# Patient Record
Sex: Female | Born: 1965 | Race: Black or African American | Hispanic: No | Marital: Married | Smoking: Never smoker
Health system: Southern US, Community
[De-identification: ages and names within clinical notes are randomized; demographics above are authoritative.]

## PROBLEM LIST (undated history)

## (undated) DIAGNOSIS — K219 Gastro-esophageal reflux disease without esophagitis: Secondary | ICD-10-CM

## (undated) DIAGNOSIS — I10 Essential (primary) hypertension: Secondary | ICD-10-CM

## (undated) DIAGNOSIS — Z8719 Personal history of other diseases of the digestive system: Secondary | ICD-10-CM

## (undated) HISTORY — PX: BREAST SURGERY: SHX581

## (undated) HISTORY — PX: CHOLECYSTECTOMY: SHX55

## (undated) HISTORY — PX: TUBAL LIGATION: SHX77

## (undated) HISTORY — PX: ABDOMINAL HYSTERECTOMY: SHX81

## (undated) HISTORY — PX: BREAST EXCISIONAL BIOPSY: SUR124

---

## 1998-01-26 ENCOUNTER — Other Ambulatory Visit: Admission: RE | Admit: 1998-01-26 | Discharge: 1998-01-26 | Payer: Self-pay | Admitting: Obstetrics

## 1999-02-16 ENCOUNTER — Other Ambulatory Visit: Admission: RE | Admit: 1999-02-16 | Discharge: 1999-02-16 | Payer: Self-pay | Admitting: Obstetrics

## 1999-02-23 ENCOUNTER — Ambulatory Visit (HOSPITAL_COMMUNITY): Admission: RE | Admit: 1999-02-23 | Discharge: 1999-02-23 | Payer: Self-pay | Admitting: Obstetrics

## 1999-02-23 ENCOUNTER — Encounter: Payer: Self-pay | Admitting: Obstetrics

## 1999-02-25 ENCOUNTER — Ambulatory Visit (HOSPITAL_COMMUNITY): Admission: RE | Admit: 1999-02-25 | Discharge: 1999-02-25 | Payer: Self-pay | Admitting: Obstetrics

## 1999-08-13 ENCOUNTER — Encounter: Admission: RE | Admit: 1999-08-13 | Discharge: 1999-08-13 | Payer: Self-pay | Admitting: Family Medicine

## 1999-08-13 ENCOUNTER — Encounter: Payer: Self-pay | Admitting: Family Medicine

## 2000-03-21 ENCOUNTER — Other Ambulatory Visit: Admission: RE | Admit: 2000-03-21 | Discharge: 2000-03-21 | Payer: Self-pay | Admitting: Obstetrics

## 2001-07-18 ENCOUNTER — Ambulatory Visit (HOSPITAL_COMMUNITY): Admission: RE | Admit: 2001-07-18 | Discharge: 2001-07-18 | Payer: Self-pay | Admitting: Gastroenterology

## 2003-10-03 ENCOUNTER — Encounter (INDEPENDENT_AMBULATORY_CARE_PROVIDER_SITE_OTHER): Payer: Self-pay | Admitting: Specialist

## 2003-10-03 ENCOUNTER — Ambulatory Visit (HOSPITAL_COMMUNITY): Admission: RE | Admit: 2003-10-03 | Discharge: 2003-10-03 | Payer: Self-pay | Admitting: Obstetrics

## 2004-02-12 ENCOUNTER — Ambulatory Visit (HOSPITAL_COMMUNITY): Admission: RE | Admit: 2004-02-12 | Discharge: 2004-02-12 | Payer: Self-pay | Admitting: Obstetrics

## 2004-02-19 ENCOUNTER — Encounter (INDEPENDENT_AMBULATORY_CARE_PROVIDER_SITE_OTHER): Payer: Self-pay | Admitting: *Deleted

## 2004-02-19 ENCOUNTER — Encounter: Admission: RE | Admit: 2004-02-19 | Discharge: 2004-02-19 | Payer: Self-pay | Admitting: Obstetrics

## 2004-04-16 ENCOUNTER — Ambulatory Visit (HOSPITAL_BASED_OUTPATIENT_CLINIC_OR_DEPARTMENT_OTHER): Admission: RE | Admit: 2004-04-16 | Discharge: 2004-04-16 | Payer: Self-pay | Admitting: General Surgery

## 2004-04-16 ENCOUNTER — Encounter: Admission: RE | Admit: 2004-04-16 | Discharge: 2004-04-16 | Payer: Self-pay | Admitting: General Surgery

## 2004-04-16 ENCOUNTER — Ambulatory Visit (HOSPITAL_COMMUNITY): Admission: RE | Admit: 2004-04-16 | Discharge: 2004-04-16 | Payer: Self-pay | Admitting: General Surgery

## 2004-04-16 ENCOUNTER — Encounter (INDEPENDENT_AMBULATORY_CARE_PROVIDER_SITE_OTHER): Payer: Self-pay | Admitting: *Deleted

## 2004-09-01 ENCOUNTER — Encounter: Admission: RE | Admit: 2004-09-01 | Discharge: 2004-09-01 | Payer: Self-pay | Admitting: Obstetrics

## 2005-04-05 ENCOUNTER — Encounter: Admission: RE | Admit: 2005-04-05 | Discharge: 2005-04-05 | Payer: Self-pay | Admitting: Obstetrics

## 2006-04-07 ENCOUNTER — Encounter: Admission: RE | Admit: 2006-04-07 | Discharge: 2006-04-07 | Payer: Self-pay | Admitting: Obstetrics

## 2007-04-23 ENCOUNTER — Encounter: Admission: RE | Admit: 2007-04-23 | Discharge: 2007-04-23 | Payer: Self-pay | Admitting: Obstetrics

## 2008-05-05 ENCOUNTER — Encounter: Admission: RE | Admit: 2008-05-05 | Discharge: 2008-05-05 | Payer: Self-pay | Admitting: Family Medicine

## 2008-10-17 ENCOUNTER — Other Ambulatory Visit: Admission: RE | Admit: 2008-10-17 | Discharge: 2008-10-17 | Payer: Self-pay | Admitting: Family Medicine

## 2009-04-21 ENCOUNTER — Encounter (INDEPENDENT_AMBULATORY_CARE_PROVIDER_SITE_OTHER): Payer: Self-pay | Admitting: Obstetrics & Gynecology

## 2009-04-21 ENCOUNTER — Ambulatory Visit (HOSPITAL_COMMUNITY): Admission: RE | Admit: 2009-04-21 | Discharge: 2009-04-22 | Payer: Self-pay | Admitting: Obstetrics & Gynecology

## 2009-05-12 ENCOUNTER — Encounter: Admission: RE | Admit: 2009-05-12 | Discharge: 2009-05-12 | Payer: Self-pay | Admitting: Obstetrics & Gynecology

## 2010-06-08 ENCOUNTER — Encounter
Admission: RE | Admit: 2010-06-08 | Discharge: 2010-06-08 | Payer: Self-pay | Source: Home / Self Care | Attending: Obstetrics & Gynecology | Admitting: Obstetrics & Gynecology

## 2010-09-15 LAB — CBC
Hemoglobin: 10.7 g/dL — ABNORMAL LOW (ref 12.0–15.0)
Hemoglobin: 13 g/dL (ref 12.0–15.0)
MCHC: 33.6 g/dL (ref 30.0–36.0)
MCHC: 33.8 g/dL (ref 30.0–36.0)
RBC: 4.31 MIL/uL (ref 3.87–5.11)
RDW: 14.1 % (ref 11.5–15.5)
RDW: 14.7 % (ref 11.5–15.5)
WBC: 8.8 10*3/uL (ref 4.0–10.5)

## 2010-09-15 LAB — PREGNANCY, URINE: Preg Test, Ur: NEGATIVE

## 2010-09-15 LAB — BASIC METABOLIC PANEL
CO2: 25 mEq/L (ref 19–32)
Chloride: 105 mEq/L (ref 96–112)
Potassium: 3.9 mEq/L (ref 3.5–5.1)

## 2010-10-29 NOTE — Op Note (Signed)
NAMEKYNADI, DRAGOS NO.:  [id number]   MEDICAL RECORD NO.:  [id number]          PATIENT TYPE:  AMB   LOCATION:  DSC                          FACILITY:  [facility]   PHYSICIAN:  Leonie Man, M.D.   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   PROCEDURE:  Needle localization followed by right-sided lumpectomy.   SURGEON:  Leonie Man, M.D.   ASSISTANT:  Nurse.   ANESTHESIA:  General.   INDICATIONS:  This patient is a 45 year old female presenting with an  abnormal mammogram showing a lesion in the 12 o'clock axis of the right  breast.  She underwent ultrasound-guided core biopsy of this lesion with  pathology showing a sclerosing papilloma.  The patient comes to the  operating room now for wide excision of this lesion just to rule out the  possibility of a papillary carcinoma.   DESCRIPTION OF PROCEDURE:  The risks and benefits of surgery had been fully  discussed with the patient.  All questions answered and consent obtained.  Following the induction of satisfactory general anesthesia, the patient was  positioned supinely.  The right breast is prepped and draped to be included  in the sterile operative field.  The region adjacent to the localizing  needle is then incised down upon, deepening this through the skin and  subcutaneous tissue.  The localizing needle is then brought into the  incision site, and an entire wedge of breast tissue surrounding the needle  is carried down just about to the chest wall.  The lesion and the breast  specimen is removed and forwarded for radiologic diagnosis.  The lesion is  identified on ultrasound of the mass.  This is noted to  be contained well  within the specimen.  Hemostasis is obtained with electrocautery, and sponge  and instrument counts clarified.  The subcutaneous tissue is closed with  interrupted 3-0 Vicryl sutures, and the skin is closed with 5-0 Monocryl  suture and then reinforced with Dermabond.  Sterile dressing is applied. The  patient removed from the operating room to the recovery room in stable  condition.  She tolerated the procedure well.      Patr   PB/MEDQ  D:  04/16/2004  T:  04/17/2004  Job:  [id number]

## 2010-10-29 NOTE — Op Note (Signed)
NAME:  Sylvia Sanford, WIETING NO.:  [id number]   MEDICAL RECORD NO.:  [id number]                   PATIENT TYPE:  AMB   LOCATION:  SDC                                  FACILITY:  [facility]   PHYSICIAN:  Kathreen Cosier, M.D.           DATE OF BIRTH:  [birth year]   DATE OF PROCEDURE:  10/03/2003  DATE OF DISCHARGE:                                 OPERATIVE REPORT   PREOPERATIVE DIAGNOSIS:  Dysfunctional uterine bleeding and menorrhagia.   PROCEDURE:  Diagnostic hysteroscopy, D&C, and NovaSure ablation.   Under general anesthesia with the patient in lithotomy position the perineum  and vagina prepped and draped, the bladder emptied with a straight catheter.  Bimanual exam revealed the uterus to be normal size, negative adnexa.  Speculum placed in the vagina.  The anterior lip of the cervix grasped with  a tenaculum and the cervix curetted.  Small amount of tissue obtained.  The  endometrial cavity sounded 9 cm.  The cervical length was measured at 4 cm  with the Hagar dilator.  The cervix was then dilated to #27 Shawnie Pons and the  diagnostic hysteroscope inserted.  The pump was set on 80 and the fluid  deficit was 50 mL.  Endometrial cavity was normal.  The NovaSure ablation  device was inserted in the endometrial cavity until the fundus was felt and  then the device pulled back 1 cm.  It was then seated and tested and the CIA  test was done with carbon dioxide and then the ablation done.  The power was  at 06 watts for 90 seconds and the cavity was 3.5 cm.  After the ablation  was complete hysteroscopy was performed again and the cavity was felt to be  totally ablated.  The patient tolerated the procedure well, taken to the  recovery room in good condition.                                               Kathreen Cosier, M.D.    BAM/MEDQ  D:  10/03/2003  T:  10/03/2003  Job:  [id number]

## 2010-10-29 NOTE — Procedures (Signed)
[facility]  Patient:    Sylvia Sanford, Sylvia Sanford Visit Number: [id number] MRN: [id number]          Service Type: Attending:  Verlin Grills, M.D. Dictated by:   Verlin Grills, M.D. Proc. Date: 07/18/01   CC:         Desma Maxim, M.D.   Procedure Report  PROCEDURE:  Esophagogastroduodenoscopy.  REFERRING PHYSICIAN:  Donia Guiles, M.D.  INDICATION FOR PROCEDURE:  Sylvia Sanford is a 45 year old female born [birth year]. She is referred to me by Dr. Donia Guiles with a six month history of heartburn unassociated with dysphagia, odynophagia, nausea, vomiting or gastrointestinal bleeding. She last underwent an esophagogastroduodenoscopy in 1994 to evaluate abdominal pain and was told she had gastritis and a hiatal hernia.  Sylvia Sanford is currently taking Aciphex which was given to her as samples by Dr. Arvilla Market. Proton pump inhibitor therapy is keeping her heartburn free.  MEDICATION ALLERGIES:  None.  CHRONIC MEDICATIONS:  1. Aciphex.  2. Serafem.  3. Ortho-Novum 777.  PAST MEDICAL HISTORY:  1. Heartburn.  2. Laparoscopic cholecystectomy.  3. Bone spur surgery.  ENDOSCOPIST:  Verlin Grills, M.D.  PREMEDICATION:  Versed 7.5 mg.  ENDOSCOPE:  Olympus gastroscope.  DESCRIPTION OF PROCEDURE:  After obtaining informed consent, the patient was placed in the left lateral decubitus position. I administered intravenous Versed to achieve conscious sedation for the procedure. The patients cardiac rhythm, oxygen saturation and blood pressure were monitored throughout the procedure and documented in the medical record.  The Olympus gastroscope was passed through the posterior hypopharynx into the proximal esophagus without difficulty. The hypopharynx and larynx appeared normal. I did not visualize the vocal chords.  ESOPHAGOSCOPY:  The proximal, mid and lower segments of the esophagus appeared normal. The esophagogastric junction  and squamocolumnar junction are noted at approximately 37 cm from the incisor teeth. Endoscopically, there is no evidence for the presence of Barretts esophagus, erosive esophagitis, esophageal mucosal scarring or esophageal ulceration.  GASTROSCOPY:  Sylvia Sanford has a small hiatal hernia. Retroflexed view of the gastric cardia and fundus was normal. The gastric body, antrum and pylorus appeared completely normal.  DUODENOSCOPY:  The duodenal bulb, mid duodenum, and distal duodenum appear normal.  ASSESSMENT: Small hiatal hernia; otherwise normal esophagogastroduodenoscopy.  RECOMMENDATIONS:  Sylvia Sanford has nonerosive esophageal reflux disease associated with a small hiatal hernia. Her proton pump inhibitor medication (Aciphex) is keeping her heartburn free. Dictated by:   Verlin Grills, M.D. Attending:  Verlin Grills, M.D. DD:  07/18/01 TD:  07/18/01 Job: [id number]

## 2010-12-10 ENCOUNTER — Other Ambulatory Visit: Payer: Self-pay | Admitting: Obstetrics & Gynecology

## 2011-05-10 ENCOUNTER — Other Ambulatory Visit: Payer: Self-pay | Admitting: Obstetrics & Gynecology

## 2011-05-10 DIAGNOSIS — Z1231 Encounter for screening mammogram for malignant neoplasm of breast: Secondary | ICD-10-CM

## 2011-06-10 ENCOUNTER — Ambulatory Visit
Admission: RE | Admit: 2011-06-10 | Discharge: 2011-06-10 | Disposition: A | Discharge status: Home / Self Care | Payer: Federal, State, Local not specified - PPO | Source: Ambulatory Visit | Attending: Obstetrics & Gynecology | Admitting: Obstetrics & Gynecology

## 2011-06-10 DIAGNOSIS — Z1231 Encounter for screening mammogram for malignant neoplasm of breast: Secondary | ICD-10-CM

## 2012-01-06 ENCOUNTER — Other Ambulatory Visit: Payer: Self-pay | Admitting: Family Medicine

## 2012-01-06 DIAGNOSIS — E049 Nontoxic goiter, unspecified: Secondary | ICD-10-CM

## 2012-01-13 ENCOUNTER — Ambulatory Visit
Admission: RE | Admit: 2012-01-13 | Discharge: 2012-01-13 | Disposition: A | Discharge status: Home / Self Care | Payer: Federal, State, Local not specified - PPO | Source: Ambulatory Visit | Attending: Family Medicine | Admitting: Family Medicine

## 2012-01-13 DIAGNOSIS — E049 Nontoxic goiter, unspecified: Secondary | ICD-10-CM

## 2012-05-09 ENCOUNTER — Other Ambulatory Visit: Payer: Self-pay | Admitting: Obstetrics & Gynecology

## 2012-05-09 DIAGNOSIS — Z1231 Encounter for screening mammogram for malignant neoplasm of breast: Secondary | ICD-10-CM

## 2012-06-20 ENCOUNTER — Ambulatory Visit
Admission: RE | Admit: 2012-06-20 | Discharge: 2012-06-20 | Disposition: A | Discharge status: Home / Self Care | Payer: Federal, State, Local not specified - PPO | Source: Ambulatory Visit | Attending: Obstetrics & Gynecology | Admitting: Obstetrics & Gynecology

## 2012-06-20 DIAGNOSIS — Z1231 Encounter for screening mammogram for malignant neoplasm of breast: Secondary | ICD-10-CM

## 2013-05-15 ENCOUNTER — Other Ambulatory Visit: Payer: Self-pay

## 2013-05-15 DIAGNOSIS — Z1231 Encounter for screening mammogram for malignant neoplasm of breast: Secondary | ICD-10-CM

## 2013-06-26 ENCOUNTER — Ambulatory Visit: Payer: Federal, State, Local not specified - PPO

## 2013-06-27 ENCOUNTER — Ambulatory Visit
Admission: RE | Admit: 2013-06-27 | Discharge: 2013-06-27 | Disposition: A | Discharge status: Home / Self Care | Payer: Federal, State, Local not specified - PPO | Source: Ambulatory Visit

## 2013-06-27 DIAGNOSIS — Z1231 Encounter for screening mammogram for malignant neoplasm of breast: Secondary | ICD-10-CM

## 2014-05-16 ENCOUNTER — Other Ambulatory Visit: Payer: Self-pay

## 2014-05-16 DIAGNOSIS — Z1231 Encounter for screening mammogram for malignant neoplasm of breast: Secondary | ICD-10-CM

## 2014-05-26 ENCOUNTER — Ambulatory Visit
Admission: RE | Admit: 2014-05-26 | Discharge: 2014-05-26 | Disposition: A | Discharge status: Home / Self Care | Payer: Federal, State, Local not specified - PPO | Source: Ambulatory Visit | Attending: Family Medicine | Admitting: Family Medicine

## 2014-05-26 ENCOUNTER — Other Ambulatory Visit: Payer: Self-pay | Admitting: Family Medicine

## 2014-05-26 DIAGNOSIS — R059 Cough, unspecified: Secondary | ICD-10-CM

## 2014-05-26 DIAGNOSIS — R05 Cough: Secondary | ICD-10-CM

## 2014-07-02 ENCOUNTER — Ambulatory Visit
Admission: RE | Admit: 2014-07-02 | Discharge: 2014-07-02 | Disposition: A | Discharge status: Home / Self Care | Payer: Federal, State, Local not specified - PPO | Source: Ambulatory Visit

## 2014-07-02 DIAGNOSIS — Z1231 Encounter for screening mammogram for malignant neoplasm of breast: Secondary | ICD-10-CM

## 2014-09-10 ENCOUNTER — Other Ambulatory Visit: Payer: Self-pay | Admitting: Gastroenterology

## 2014-11-24 ENCOUNTER — Encounter (HOSPITAL_COMMUNITY): Payer: Self-pay | Admitting: *Deleted

## 2014-12-01 ENCOUNTER — Other Ambulatory Visit: Payer: Self-pay | Admitting: Gastroenterology

## 2014-12-01 NOTE — Anesthesia Preprocedure Evaluation (Signed)
Anesthesia Evaluation  Patient identified by MRN, date of birth, ID band Patient awake    Reviewed: Allergy & Precautions, NPO status , Patient's Chart, lab work & pertinent test results  History of Anesthesia Complications Negative for: history of anesthetic complications  Airway Mallampati: II  TM Distance: >3 FB Neck ROM: Full    Dental no notable dental hx. (+) Dental Advisory Given   Pulmonary neg pulmonary ROS,  breath sounds clear to auscultation  Pulmonary exam normal       Cardiovascular hypertension, Pt. on medications Normal cardiovascular examRhythm:Regular Rate:Normal     Neuro/Psych negative neurological ROS  negative psych ROS   GI/Hepatic Neg liver ROS, hiatal hernia, GERD-  Medicated and Controlled,  Endo/Other  negative endocrine ROS  Renal/GU negative Renal ROS  negative genitourinary   Musculoskeletal negative musculoskeletal ROS (+)   Abdominal   Peds negative pediatric ROS (+)  Hematology negative hematology ROS (+)   Anesthesia Other Findings   Reproductive/Obstetrics negative OB ROS                             Anesthesia Physical Anesthesia Plan  ASA: II  Anesthesia Plan: MAC   Post-op Pain Management:    Induction: Intravenous  Airway Management Planned: Nasal Cannula  Additional Equipment:   Intra-op Plan:   Post-operative Plan:   Informed Consent: I have reviewed the patients History and Physical, chart, labs and discussed the procedure including the risks, benefits and alternatives for the proposed anesthesia with the patient or authorized representative who has indicated his/her understanding and acceptance.   Dental advisory given  Plan Discussed with: CRNA  Anesthesia Plan Comments:         Anesthesia Quick Evaluation

## 2014-12-02 ENCOUNTER — Ambulatory Visit (HOSPITAL_COMMUNITY)
Admission: RE | Admit: 2014-12-02 | Discharge: 2014-12-02 | Disposition: A | Discharge status: Home / Self Care | Payer: Federal, State, Local not specified - PPO | Source: Ambulatory Visit | Attending: Gastroenterology | Admitting: Gastroenterology

## 2014-12-02 ENCOUNTER — Ambulatory Visit (HOSPITAL_COMMUNITY): Payer: Federal, State, Local not specified - PPO | Admitting: Anesthesiology

## 2014-12-02 ENCOUNTER — Encounter (HOSPITAL_COMMUNITY)
Admission: RE | Disposition: A | Discharge status: Home / Self Care | Payer: Self-pay | Source: Ambulatory Visit | Attending: Gastroenterology

## 2014-12-02 ENCOUNTER — Encounter (HOSPITAL_COMMUNITY): Payer: Self-pay

## 2014-12-02 DIAGNOSIS — Z79899 Other long term (current) drug therapy: Secondary | ICD-10-CM | POA: Diagnosis not present

## 2014-12-02 DIAGNOSIS — I10 Essential (primary) hypertension: Secondary | ICD-10-CM | POA: Diagnosis not present

## 2014-12-02 DIAGNOSIS — Z9049 Acquired absence of other specified parts of digestive tract: Secondary | ICD-10-CM | POA: Diagnosis not present

## 2014-12-02 DIAGNOSIS — K219 Gastro-esophageal reflux disease without esophagitis: Secondary | ICD-10-CM | POA: Insufficient documentation

## 2014-12-02 DIAGNOSIS — Z1211 Encounter for screening for malignant neoplasm of colon: Secondary | ICD-10-CM | POA: Diagnosis not present

## 2014-12-02 HISTORY — DX: Personal history of other diseases of the digestive system: Z87.19

## 2014-12-02 HISTORY — PX: COLONOSCOPY WITH PROPOFOL: SHX5780

## 2014-12-02 HISTORY — DX: Gastro-esophageal reflux disease without esophagitis: K21.9

## 2014-12-02 HISTORY — DX: Essential (primary) hypertension: I10

## 2014-12-02 SURGERY — COLONOSCOPY WITH PROPOFOL
Anesthesia: Monitor Anesthesia Care

## 2014-12-02 MED ORDER — SODIUM CHLORIDE 0.9 % IV SOLN: INTRAVENOUS | Status: DC

## 2014-12-02 MED ORDER — PROPOFOL 10 MG/ML IV BOLUS
INTRAVENOUS | Status: DC | PRN
  Administered 2014-12-02: 30 mg via INTRAVENOUS
  Administered 2014-12-02: 20 mg via INTRAVENOUS
  Administered 2014-12-02: 50 mg via INTRAVENOUS
  Administered 2014-12-02: 20 mg via INTRAVENOUS

## 2014-12-02 MED ORDER — PROPOFOL 10 MG/ML IV BOLUS
INTRAVENOUS | Status: AC
  Filled 2014-12-02: qty 20

## 2014-12-02 MED ORDER — PROPOFOL INFUSION 10 MG/ML OPTIME
INTRAVENOUS | Status: DC | PRN
  Administered 2014-12-02: 100 ug/kg/min via INTRAVENOUS

## 2014-12-02 MED ORDER — LACTATED RINGERS IV SOLN
INTRAVENOUS | Status: DC
  Administered 2014-12-02: 1000 mL via INTRAVENOUS

## 2014-12-02 SURGICAL SUPPLY — 21 items

## 2014-12-02 NOTE — Anesthesia Postprocedure Evaluation (Signed)
  Anesthesia Post-op Note  Patient: Sylvia Sanford  Procedure(s) Performed: Procedure(s) (LRB): COLONOSCOPY WITH PROPOFOL (N/A)  Patient Location: PACU  Anesthesia Type: MAC  Level of Consciousness: awake and alert   Airway and Oxygen Therapy: Patient Spontanous Breathing  Post-op Pain: mild  Post-op Assessment: Post-op Vital signs reviewed, Patient's Cardiovascular Status Stable, Respiratory Function Stable, Patent Airway and No signs of Nausea or vomiting  Last Vitals:  Filed Vitals:   12/02/14 0814  BP: 132/90  Pulse: 88  Temp: 36.8 C  Resp: 21    Post-op Vital Signs: stable   Complications: No apparent anesthesia complications

## 2014-12-02 NOTE — Op Note (Signed)
Procedure: Baseline screening colonoscopy  Endoscopist: Danise Edge  Premedication: Propofol administered by anesthesia  Procedure: The patient was placed in the left lateral decubitus position. Anal inspection and digital rectal exam were normal. The Pentax pediatric colonoscope was introduced into the rectum and advanced to the cecum. A normal-appearing appendiceal orifice was identified. A normal-appearing ileocecal valve was intubated and the terminal ileum inspected. Colonic preparation for the exam today was good. Withdrawal time was 10 minutes  Rectum. Normal. Retroflexed view of the distal rectum was normal  Sigmoid colon and descending colon. Normal  Splenic flexure. Normal  Transverse colon. Normal  Hepatic flexure. Normal  Ascending colon. Normal  Cecum and ileocecal valve. Normal  Terminal ileum. Normal  Assessment: Normal screening colonoscopy  Recommendation: Schedule repeat screening colonoscopy in 10 years

## 2014-12-02 NOTE — H&P (Signed)
  Procedure: Baseline screening colonoscopy. No family history of colon cancer. Normal esophagogastroduodenoscopy performed on 03/31/2008  History: Patient is a 49 year old female born [birth year]. She is scheduled to undergo a screening colonoscopy with polypectomy to prevent colon cancer.  Past medical history: Gastroesophageal reflux associated with a normal esophagogastroduodenoscopy. Hysterectomy. Cholecystectomy.  Medication allergies: Duratuss causes vomiting  Exam: The patient is alert and lying comfortably on the endoscopy stretcher. Abdomen is soft and nontender to palpation. Lungs are clear to auscultation. Cardiac reveals a regular rhythm.  Plan: Proceed with baseline screening colonoscopy

## 2014-12-02 NOTE — Transfer of Care (Signed)
Immediate Anesthesia Transfer of Care Note  Patient: Sylvia Sanford  Procedure(s) Performed: Procedure(s): COLONOSCOPY WITH PROPOFOL (N/A)  Patient Location: PACU  Anesthesia Type:MAC  Level of Consciousness:  sedated, patient cooperative and responds to stimulation  Airway & Oxygen Therapy:Patient Spontanous Breathing and Patient connected to face mask oxgen  Post-op Assessment:  Report given to PACU RN and Post -op Vital signs reviewed and stable  Post vital signs:  Reviewed and stable  Last Vitals:  Filed Vitals:   12/02/14 0814  BP: 132/90  Pulse: 88  Temp: 36.8 C  Resp: 21    Complications: No apparent anesthesia complications

## 2014-12-03 ENCOUNTER — Encounter (HOSPITAL_COMMUNITY): Payer: Self-pay | Admitting: Gastroenterology

## 2015-06-11 ENCOUNTER — Other Ambulatory Visit: Payer: Self-pay

## 2015-06-11 DIAGNOSIS — Z1231 Encounter for screening mammogram for malignant neoplasm of breast: Secondary | ICD-10-CM

## 2015-07-08 ENCOUNTER — Ambulatory Visit
Admission: RE | Admit: 2015-07-08 | Discharge: 2015-07-08 | Disposition: A | Discharge status: Home / Self Care | Payer: Federal, State, Local not specified - PPO | Source: Ambulatory Visit

## 2015-07-08 DIAGNOSIS — Z1231 Encounter for screening mammogram for malignant neoplasm of breast: Secondary | ICD-10-CM

## 2015-09-04 ENCOUNTER — Other Ambulatory Visit: Payer: Self-pay | Admitting: Family Medicine

## 2015-09-04 ENCOUNTER — Ambulatory Visit
Admission: RE | Admit: 2015-09-04 | Discharge: 2015-09-04 | Disposition: A | Discharge status: Home / Self Care | Payer: Federal, State, Local not specified - PPO | Source: Ambulatory Visit | Attending: Family Medicine | Admitting: Family Medicine

## 2015-09-04 DIAGNOSIS — M7150 Other bursitis, not elsewhere classified, unspecified site: Secondary | ICD-10-CM

## 2015-10-14 DIAGNOSIS — H1045 Other chronic allergic conjunctivitis: Secondary | ICD-10-CM | POA: Diagnosis not present

## 2015-12-22 DIAGNOSIS — T148 Other injury of unspecified body region: Secondary | ICD-10-CM | POA: Diagnosis not present

## 2016-01-04 DIAGNOSIS — E782 Mixed hyperlipidemia: Secondary | ICD-10-CM | POA: Diagnosis not present

## 2016-01-20 ENCOUNTER — Other Ambulatory Visit: Payer: Self-pay | Admitting: Family Medicine

## 2016-01-20 ENCOUNTER — Ambulatory Visit
Admission: RE | Admit: 2016-01-20 | Discharge: 2016-01-20 | Disposition: A | Discharge status: Home / Self Care | Payer: Federal, State, Local not specified - PPO | Source: Ambulatory Visit | Attending: Family Medicine | Admitting: Family Medicine

## 2016-01-20 DIAGNOSIS — M7989 Other specified soft tissue disorders: Secondary | ICD-10-CM | POA: Diagnosis not present

## 2016-01-20 DIAGNOSIS — M79641 Pain in right hand: Secondary | ICD-10-CM

## 2016-04-14 DIAGNOSIS — Z6830 Body mass index (BMI) 30.0-30.9, adult: Secondary | ICD-10-CM | POA: Diagnosis not present

## 2016-04-14 DIAGNOSIS — Z01419 Encounter for gynecological examination (general) (routine) without abnormal findings: Secondary | ICD-10-CM | POA: Diagnosis not present

## 2016-06-07 DIAGNOSIS — M549 Dorsalgia, unspecified: Secondary | ICD-10-CM | POA: Diagnosis not present

## 2016-06-10 DIAGNOSIS — S29012A Strain of muscle and tendon of back wall of thorax, initial encounter: Secondary | ICD-10-CM | POA: Diagnosis not present

## 2016-06-10 DIAGNOSIS — S46911A Strain of unspecified muscle, fascia and tendon at shoulder and upper arm level, right arm, initial encounter: Secondary | ICD-10-CM | POA: Diagnosis not present

## 2016-06-15 DIAGNOSIS — S46911A Strain of unspecified muscle, fascia and tendon at shoulder and upper arm level, right arm, initial encounter: Secondary | ICD-10-CM | POA: Diagnosis not present

## 2016-06-15 DIAGNOSIS — S29012A Strain of muscle and tendon of back wall of thorax, initial encounter: Secondary | ICD-10-CM | POA: Diagnosis not present

## 2016-06-16 ENCOUNTER — Other Ambulatory Visit: Payer: Self-pay | Admitting: Obstetrics & Gynecology

## 2016-06-16 DIAGNOSIS — Z1231 Encounter for screening mammogram for malignant neoplasm of breast: Secondary | ICD-10-CM

## 2016-06-17 DIAGNOSIS — S46911A Strain of unspecified muscle, fascia and tendon at shoulder and upper arm level, right arm, initial encounter: Secondary | ICD-10-CM | POA: Diagnosis not present

## 2016-06-17 DIAGNOSIS — S29012A Strain of muscle and tendon of back wall of thorax, initial encounter: Secondary | ICD-10-CM | POA: Diagnosis not present

## 2016-06-20 DIAGNOSIS — S46911A Strain of unspecified muscle, fascia and tendon at shoulder and upper arm level, right arm, initial encounter: Secondary | ICD-10-CM | POA: Diagnosis not present

## 2016-06-20 DIAGNOSIS — S29012A Strain of muscle and tendon of back wall of thorax, initial encounter: Secondary | ICD-10-CM | POA: Diagnosis not present

## 2016-06-27 DIAGNOSIS — S29012A Strain of muscle and tendon of back wall of thorax, initial encounter: Secondary | ICD-10-CM | POA: Diagnosis not present

## 2016-06-27 DIAGNOSIS — S46911A Strain of unspecified muscle, fascia and tendon at shoulder and upper arm level, right arm, initial encounter: Secondary | ICD-10-CM | POA: Diagnosis not present

## 2016-07-01 DIAGNOSIS — S46911A Strain of unspecified muscle, fascia and tendon at shoulder and upper arm level, right arm, initial encounter: Secondary | ICD-10-CM | POA: Diagnosis not present

## 2016-07-01 DIAGNOSIS — S29012A Strain of muscle and tendon of back wall of thorax, initial encounter: Secondary | ICD-10-CM | POA: Diagnosis not present

## 2016-07-06 DIAGNOSIS — S29012A Strain of muscle and tendon of back wall of thorax, initial encounter: Secondary | ICD-10-CM | POA: Diagnosis not present

## 2016-07-06 DIAGNOSIS — S46911A Strain of unspecified muscle, fascia and tendon at shoulder and upper arm level, right arm, initial encounter: Secondary | ICD-10-CM | POA: Diagnosis not present

## 2016-07-08 ENCOUNTER — Ambulatory Visit
Admission: RE | Admit: 2016-07-08 | Discharge: 2016-07-08 | Disposition: A | Discharge status: Home / Self Care | Payer: Federal, State, Local not specified - PPO | Source: Ambulatory Visit | Attending: Obstetrics & Gynecology | Admitting: Obstetrics & Gynecology

## 2016-07-08 ENCOUNTER — Ambulatory Visit: Payer: Federal, State, Local not specified - PPO

## 2016-07-08 DIAGNOSIS — S46911A Strain of unspecified muscle, fascia and tendon at shoulder and upper arm level, right arm, initial encounter: Secondary | ICD-10-CM | POA: Diagnosis not present

## 2016-07-08 DIAGNOSIS — S29012A Strain of muscle and tendon of back wall of thorax, initial encounter: Secondary | ICD-10-CM | POA: Diagnosis not present

## 2016-07-08 DIAGNOSIS — Z1231 Encounter for screening mammogram for malignant neoplasm of breast: Secondary | ICD-10-CM

## 2016-07-15 DIAGNOSIS — S46911A Strain of unspecified muscle, fascia and tendon at shoulder and upper arm level, right arm, initial encounter: Secondary | ICD-10-CM | POA: Diagnosis not present

## 2016-07-15 DIAGNOSIS — S29012A Strain of muscle and tendon of back wall of thorax, initial encounter: Secondary | ICD-10-CM | POA: Diagnosis not present

## 2016-07-20 DIAGNOSIS — S46911A Strain of unspecified muscle, fascia and tendon at shoulder and upper arm level, right arm, initial encounter: Secondary | ICD-10-CM | POA: Diagnosis not present

## 2016-07-20 DIAGNOSIS — S29012A Strain of muscle and tendon of back wall of thorax, initial encounter: Secondary | ICD-10-CM | POA: Diagnosis not present

## 2016-08-17 DIAGNOSIS — E782 Mixed hyperlipidemia: Secondary | ICD-10-CM | POA: Diagnosis not present

## 2016-08-17 DIAGNOSIS — R000 Tachycardia, unspecified: Secondary | ICD-10-CM | POA: Diagnosis not present

## 2016-08-17 DIAGNOSIS — I10 Essential (primary) hypertension: Secondary | ICD-10-CM | POA: Diagnosis not present

## 2016-08-17 DIAGNOSIS — Z0000 Encounter for general adult medical examination without abnormal findings: Secondary | ICD-10-CM | POA: Diagnosis not present

## 2016-08-17 DIAGNOSIS — E559 Vitamin D deficiency, unspecified: Secondary | ICD-10-CM | POA: Diagnosis not present

## 2016-11-02 DIAGNOSIS — M542 Cervicalgia: Secondary | ICD-10-CM | POA: Diagnosis not present

## 2016-11-02 DIAGNOSIS — M25511 Pain in right shoulder: Secondary | ICD-10-CM | POA: Diagnosis not present

## 2016-11-14 DIAGNOSIS — R8290 Unspecified abnormal findings in urine: Secondary | ICD-10-CM | POA: Diagnosis not present

## 2016-11-14 DIAGNOSIS — N760 Acute vaginitis: Secondary | ICD-10-CM | POA: Diagnosis not present

## 2016-12-07 DIAGNOSIS — H40013 Open angle with borderline findings, low risk, bilateral: Secondary | ICD-10-CM | POA: Diagnosis not present

## 2017-03-29 DIAGNOSIS — L292 Pruritus vulvae: Secondary | ICD-10-CM | POA: Diagnosis not present

## 2017-03-29 DIAGNOSIS — N898 Other specified noninflammatory disorders of vagina: Secondary | ICD-10-CM | POA: Diagnosis not present

## 2017-03-29 DIAGNOSIS — N760 Acute vaginitis: Secondary | ICD-10-CM | POA: Diagnosis not present

## 2017-03-29 DIAGNOSIS — N951 Menopausal and female climacteric states: Secondary | ICD-10-CM | POA: Diagnosis not present

## 2017-05-16 ENCOUNTER — Other Ambulatory Visit: Payer: Self-pay | Admitting: Family Medicine

## 2017-05-16 DIAGNOSIS — R102 Pelvic and perineal pain: Secondary | ICD-10-CM

## 2017-05-16 DIAGNOSIS — R1032 Left lower quadrant pain: Secondary | ICD-10-CM | POA: Diagnosis not present

## 2017-05-22 ENCOUNTER — Other Ambulatory Visit: Payer: Federal, State, Local not specified - PPO

## 2017-05-23 ENCOUNTER — Ambulatory Visit
Admission: RE | Admit: 2017-05-23 | Discharge: 2017-05-23 | Disposition: A | Discharge status: Home / Self Care | Payer: Federal, State, Local not specified - PPO | Source: Ambulatory Visit | Attending: Family Medicine | Admitting: Family Medicine

## 2017-05-23 DIAGNOSIS — R102 Pelvic and perineal pain: Secondary | ICD-10-CM

## 2017-05-24 DIAGNOSIS — Z01419 Encounter for gynecological examination (general) (routine) without abnormal findings: Secondary | ICD-10-CM | POA: Diagnosis not present

## 2017-05-24 DIAGNOSIS — Z6830 Body mass index (BMI) 30.0-30.9, adult: Secondary | ICD-10-CM | POA: Diagnosis not present

## 2017-06-28 ENCOUNTER — Other Ambulatory Visit: Payer: Self-pay | Admitting: Family Medicine

## 2017-06-28 DIAGNOSIS — Z1231 Encounter for screening mammogram for malignant neoplasm of breast: Secondary | ICD-10-CM

## 2017-07-21 ENCOUNTER — Ambulatory Visit
Admission: RE | Admit: 2017-07-21 | Discharge: 2017-07-21 | Disposition: A | Discharge status: Home / Self Care | Payer: Federal, State, Local not specified - PPO | Source: Ambulatory Visit | Attending: Family Medicine | Admitting: Family Medicine

## 2017-07-21 DIAGNOSIS — Z1231 Encounter for screening mammogram for malignant neoplasm of breast: Secondary | ICD-10-CM

## 2017-07-24 ENCOUNTER — Other Ambulatory Visit: Payer: Self-pay | Admitting: Family Medicine

## 2017-07-24 DIAGNOSIS — R928 Other abnormal and inconclusive findings on diagnostic imaging of breast: Secondary | ICD-10-CM

## 2017-07-24 DIAGNOSIS — R21 Rash and other nonspecific skin eruption: Secondary | ICD-10-CM | POA: Diagnosis not present

## 2017-07-26 ENCOUNTER — Other Ambulatory Visit: Payer: Self-pay | Admitting: Family Medicine

## 2017-07-26 ENCOUNTER — Ambulatory Visit
Admission: RE | Admit: 2017-07-26 | Discharge: 2017-07-26 | Disposition: A | Discharge status: Home / Self Care | Payer: Federal, State, Local not specified - PPO | Source: Ambulatory Visit | Attending: Family Medicine | Admitting: Family Medicine

## 2017-07-26 DIAGNOSIS — N6489 Other specified disorders of breast: Secondary | ICD-10-CM | POA: Diagnosis not present

## 2017-07-26 DIAGNOSIS — N6310 Unspecified lump in the right breast, unspecified quadrant: Secondary | ICD-10-CM

## 2017-07-26 DIAGNOSIS — R928 Other abnormal and inconclusive findings on diagnostic imaging of breast: Secondary | ICD-10-CM | POA: Diagnosis not present

## 2017-08-23 DIAGNOSIS — Z0000 Encounter for general adult medical examination without abnormal findings: Secondary | ICD-10-CM | POA: Diagnosis not present

## 2017-08-23 DIAGNOSIS — E559 Vitamin D deficiency, unspecified: Secondary | ICD-10-CM | POA: Diagnosis not present

## 2017-08-23 DIAGNOSIS — E782 Mixed hyperlipidemia: Secondary | ICD-10-CM | POA: Diagnosis not present

## 2017-08-23 DIAGNOSIS — I10 Essential (primary) hypertension: Secondary | ICD-10-CM | POA: Diagnosis not present

## 2017-08-23 DIAGNOSIS — Z1159 Encounter for screening for other viral diseases: Secondary | ICD-10-CM | POA: Diagnosis not present

## 2017-09-13 DIAGNOSIS — K219 Gastro-esophageal reflux disease without esophagitis: Secondary | ICD-10-CM | POA: Diagnosis not present

## 2017-10-16 DIAGNOSIS — K219 Gastro-esophageal reflux disease without esophagitis: Secondary | ICD-10-CM | POA: Diagnosis not present

## 2017-10-16 DIAGNOSIS — K2930 Chronic superficial gastritis without bleeding: Secondary | ICD-10-CM | POA: Diagnosis not present

## 2017-10-16 DIAGNOSIS — K222 Esophageal obstruction: Secondary | ICD-10-CM | POA: Diagnosis not present

## 2017-10-16 DIAGNOSIS — K449 Diaphragmatic hernia without obstruction or gangrene: Secondary | ICD-10-CM | POA: Diagnosis not present

## 2017-10-19 DIAGNOSIS — K2930 Chronic superficial gastritis without bleeding: Secondary | ICD-10-CM | POA: Diagnosis not present

## 2017-11-29 DIAGNOSIS — E559 Vitamin D deficiency, unspecified: Secondary | ICD-10-CM | POA: Diagnosis not present

## 2017-12-08 DIAGNOSIS — H1045 Other chronic allergic conjunctivitis: Secondary | ICD-10-CM | POA: Diagnosis not present

## 2018-01-25 ENCOUNTER — Ambulatory Visit
Admission: RE | Admit: 2018-01-25 | Discharge: 2018-01-25 | Disposition: A | Discharge status: Home / Self Care | Payer: Federal, State, Local not specified - PPO | Source: Ambulatory Visit | Attending: Family Medicine | Admitting: Family Medicine

## 2018-01-25 ENCOUNTER — Ambulatory Visit: Admission: RE | Admit: 2018-01-25 | Payer: Federal, State, Local not specified - PPO | Source: Ambulatory Visit

## 2018-01-25 DIAGNOSIS — R928 Other abnormal and inconclusive findings on diagnostic imaging of breast: Secondary | ICD-10-CM | POA: Diagnosis not present

## 2018-01-25 DIAGNOSIS — N6310 Unspecified lump in the right breast, unspecified quadrant: Secondary | ICD-10-CM

## 2018-06-19 DIAGNOSIS — Z6831 Body mass index (BMI) 31.0-31.9, adult: Secondary | ICD-10-CM | POA: Diagnosis not present

## 2018-06-19 DIAGNOSIS — Z01419 Encounter for gynecological examination (general) (routine) without abnormal findings: Secondary | ICD-10-CM | POA: Diagnosis not present

## 2018-07-05 ENCOUNTER — Other Ambulatory Visit: Payer: Self-pay | Admitting: Family Medicine

## 2018-07-05 DIAGNOSIS — Z1231 Encounter for screening mammogram for malignant neoplasm of breast: Secondary | ICD-10-CM

## 2018-08-06 ENCOUNTER — Ambulatory Visit
Admission: RE | Admit: 2018-08-06 | Discharge: 2018-08-06 | Disposition: A | Discharge status: Home / Self Care | Payer: Federal, State, Local not specified - PPO | Source: Ambulatory Visit | Attending: Family Medicine | Admitting: Family Medicine

## 2018-08-06 DIAGNOSIS — Z1231 Encounter for screening mammogram for malignant neoplasm of breast: Secondary | ICD-10-CM

## 2018-08-20 DIAGNOSIS — Z23 Encounter for immunization: Secondary | ICD-10-CM | POA: Diagnosis not present

## 2018-08-20 DIAGNOSIS — L918 Other hypertrophic disorders of the skin: Secondary | ICD-10-CM | POA: Diagnosis not present

## 2018-12-28 DIAGNOSIS — R1032 Left lower quadrant pain: Secondary | ICD-10-CM | POA: Diagnosis not present

## 2018-12-28 DIAGNOSIS — R350 Frequency of micturition: Secondary | ICD-10-CM | POA: Diagnosis not present

## 2018-12-28 DIAGNOSIS — R102 Pelvic and perineal pain: Secondary | ICD-10-CM | POA: Diagnosis not present

## 2018-12-31 DIAGNOSIS — R1032 Left lower quadrant pain: Secondary | ICD-10-CM | POA: Diagnosis not present

## 2019-05-22 DIAGNOSIS — H40013 Open angle with borderline findings, low risk, bilateral: Secondary | ICD-10-CM | POA: Diagnosis not present

## 2019-07-05 DIAGNOSIS — Z01419 Encounter for gynecological examination (general) (routine) without abnormal findings: Secondary | ICD-10-CM | POA: Diagnosis not present

## 2019-07-05 DIAGNOSIS — Z6832 Body mass index (BMI) 32.0-32.9, adult: Secondary | ICD-10-CM | POA: Diagnosis not present

## 2019-07-05 DIAGNOSIS — Z1231 Encounter for screening mammogram for malignant neoplasm of breast: Secondary | ICD-10-CM | POA: Diagnosis not present

## 2019-10-09 DIAGNOSIS — E782 Mixed hyperlipidemia: Secondary | ICD-10-CM | POA: Diagnosis not present

## 2019-10-09 DIAGNOSIS — E559 Vitamin D deficiency, unspecified: Secondary | ICD-10-CM | POA: Diagnosis not present

## 2019-10-09 DIAGNOSIS — I10 Essential (primary) hypertension: Secondary | ICD-10-CM | POA: Diagnosis not present

## 2019-10-09 DIAGNOSIS — Z0000 Encounter for general adult medical examination without abnormal findings: Secondary | ICD-10-CM | POA: Diagnosis not present

## 2019-11-05 DIAGNOSIS — L700 Acne vulgaris: Secondary | ICD-10-CM | POA: Diagnosis not present

## 2019-11-05 DIAGNOSIS — L810 Postinflammatory hyperpigmentation: Secondary | ICD-10-CM | POA: Diagnosis not present

## 2020-02-04 DIAGNOSIS — M545 Low back pain: Secondary | ICD-10-CM | POA: Diagnosis not present

## 2020-03-26 DIAGNOSIS — J029 Acute pharyngitis, unspecified: Secondary | ICD-10-CM | POA: Diagnosis not present

## 2020-03-26 DIAGNOSIS — Z20822 Contact with and (suspected) exposure to covid-19: Secondary | ICD-10-CM | POA: Diagnosis not present

## 2020-04-18 DIAGNOSIS — Z23 Encounter for immunization: Secondary | ICD-10-CM | POA: Diagnosis not present

## 2020-05-04 DIAGNOSIS — M779 Enthesopathy, unspecified: Secondary | ICD-10-CM | POA: Diagnosis not present

## 2020-05-28 DIAGNOSIS — M65311 Trigger thumb, right thumb: Secondary | ICD-10-CM | POA: Diagnosis not present

## 2020-07-14 DIAGNOSIS — Z1231 Encounter for screening mammogram for malignant neoplasm of breast: Secondary | ICD-10-CM | POA: Diagnosis not present

## 2020-07-22 DIAGNOSIS — H40013 Open angle with borderline findings, low risk, bilateral: Secondary | ICD-10-CM | POA: Diagnosis not present

## 2020-07-22 DIAGNOSIS — H5213 Myopia, bilateral: Secondary | ICD-10-CM | POA: Diagnosis not present

## 2020-08-03 IMAGING — MG DIGITAL DIAGNOSTIC UNILATERAL RIGHT MAMMOGRAM WITH TOMO AND CAD
4 series · 4 of 12 positions shown · non-contrast
Comparison: Previous exam(s).

CLINICAL DATA: 52-year-old female for six-month follow-up of
possible RIGHT breast mass.

EXAM:
DIGITAL DIAGNOSTIC UNILATERAL RIGHT MAMMOGRAM WITH CAD AND TOMO

[R MLO synth-2D]
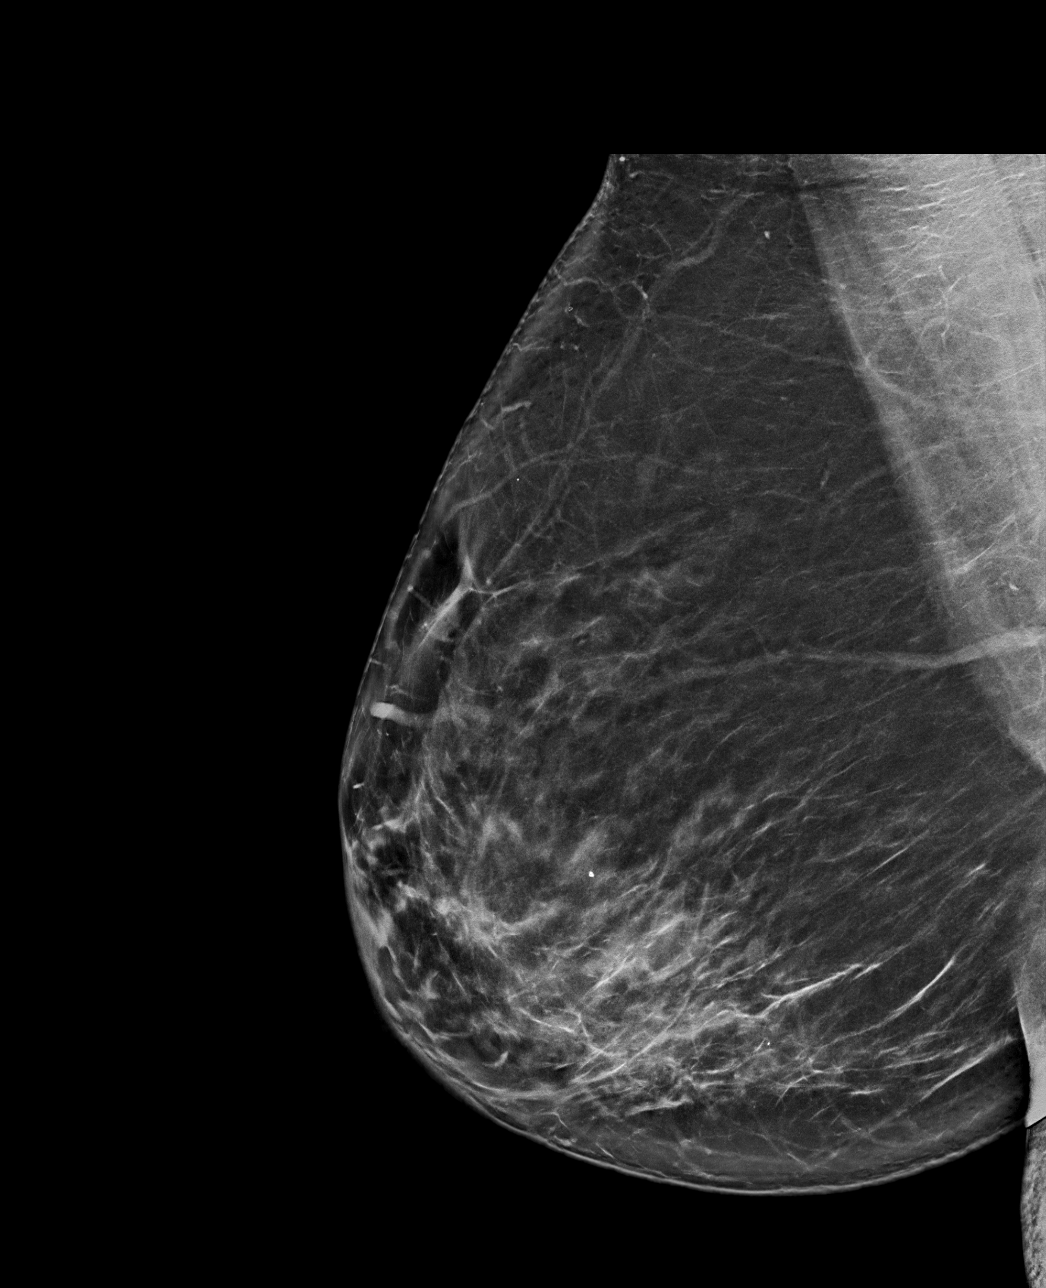

[R CC synth-2D]
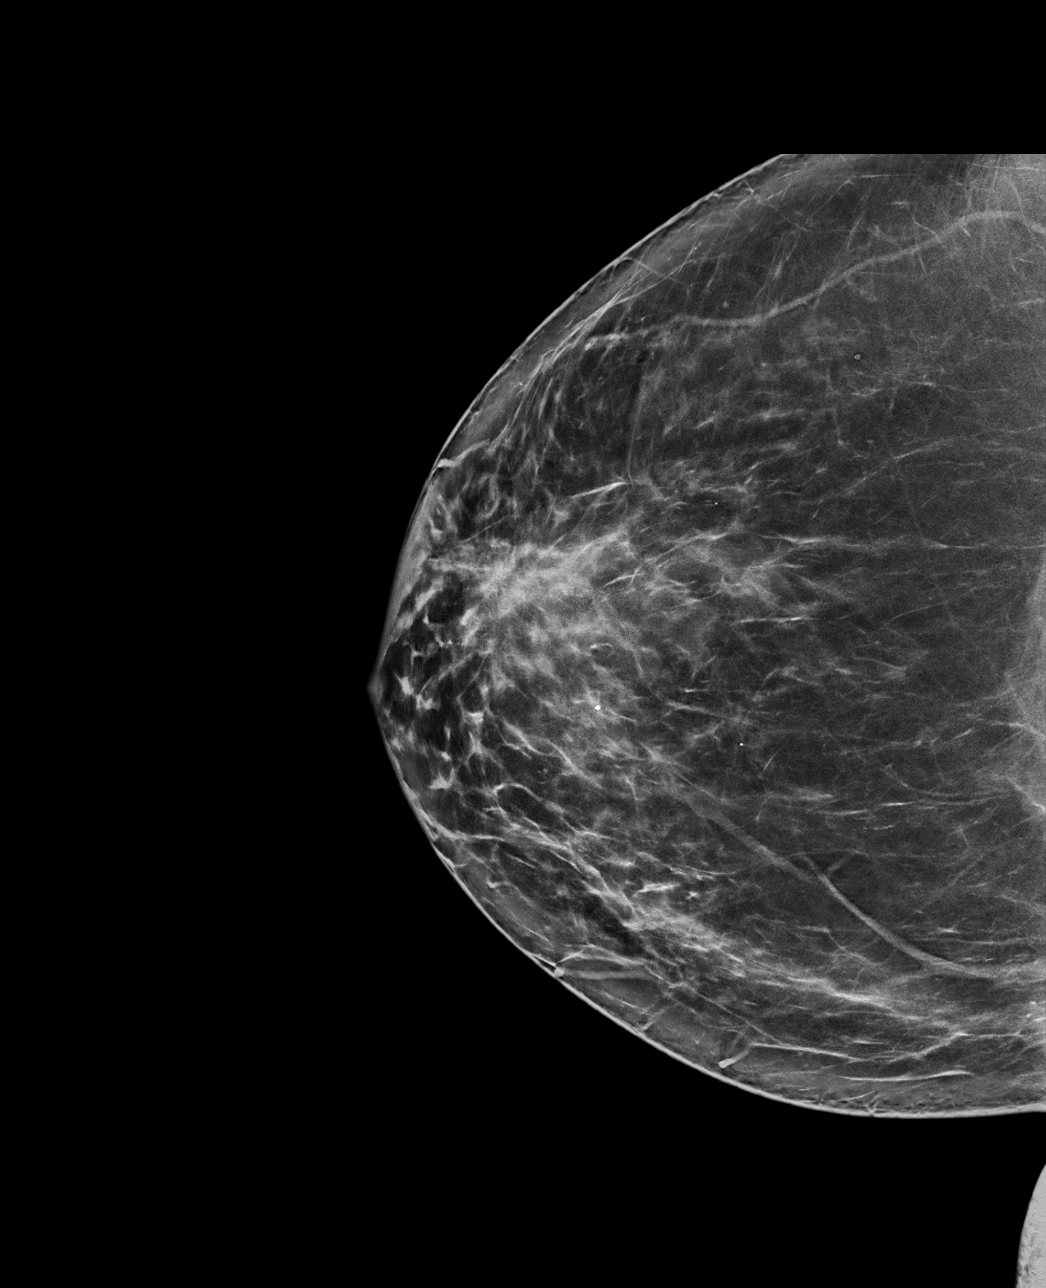

[R CC tomo · tomo slice 44/87.0]
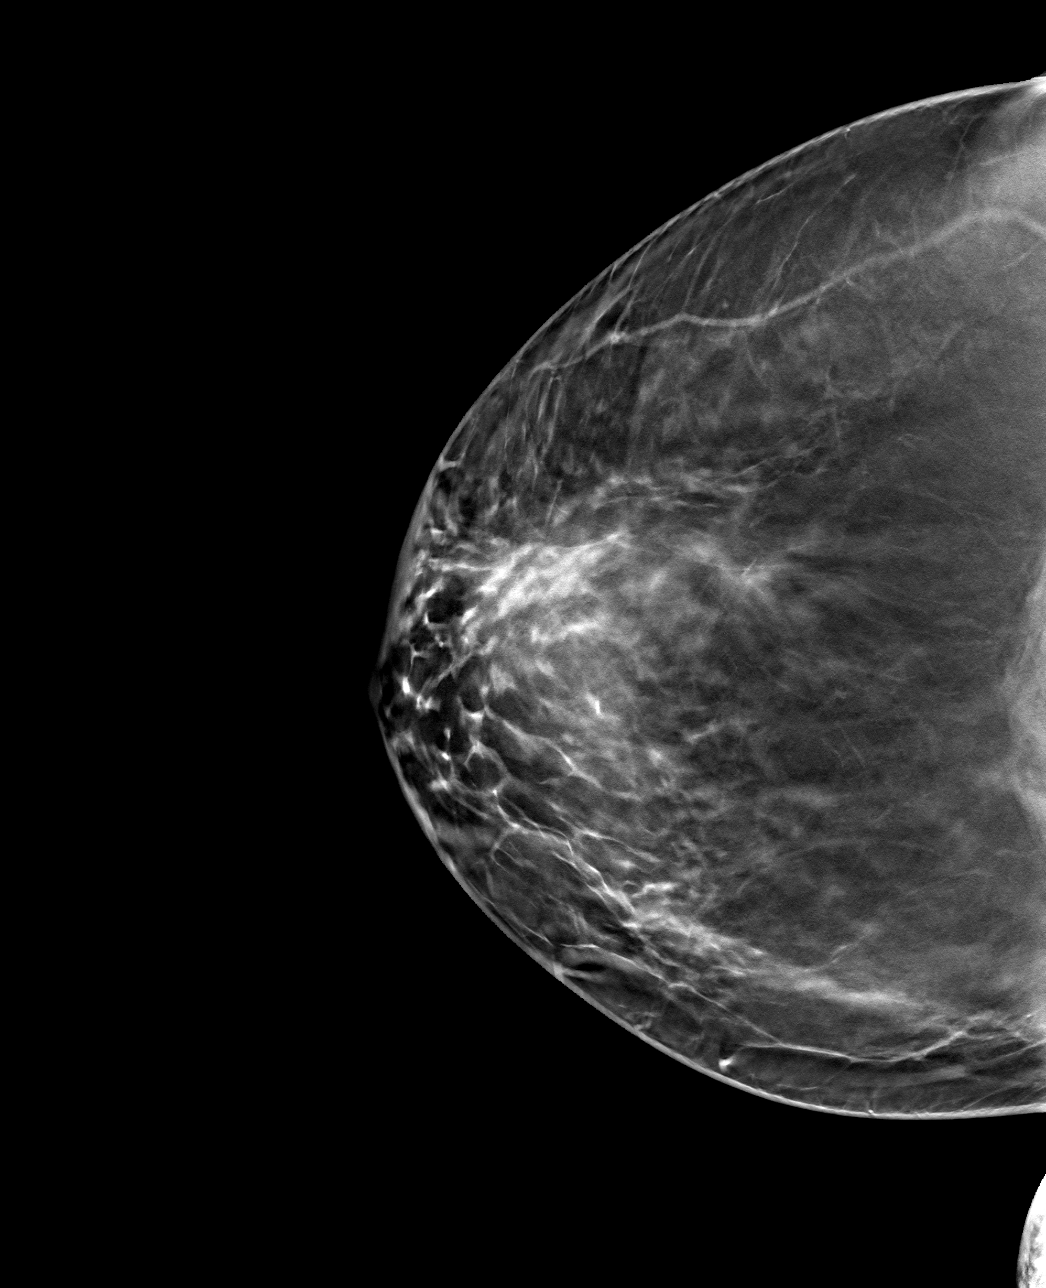

[R MLO tomo · tomo slice 47/93.0]
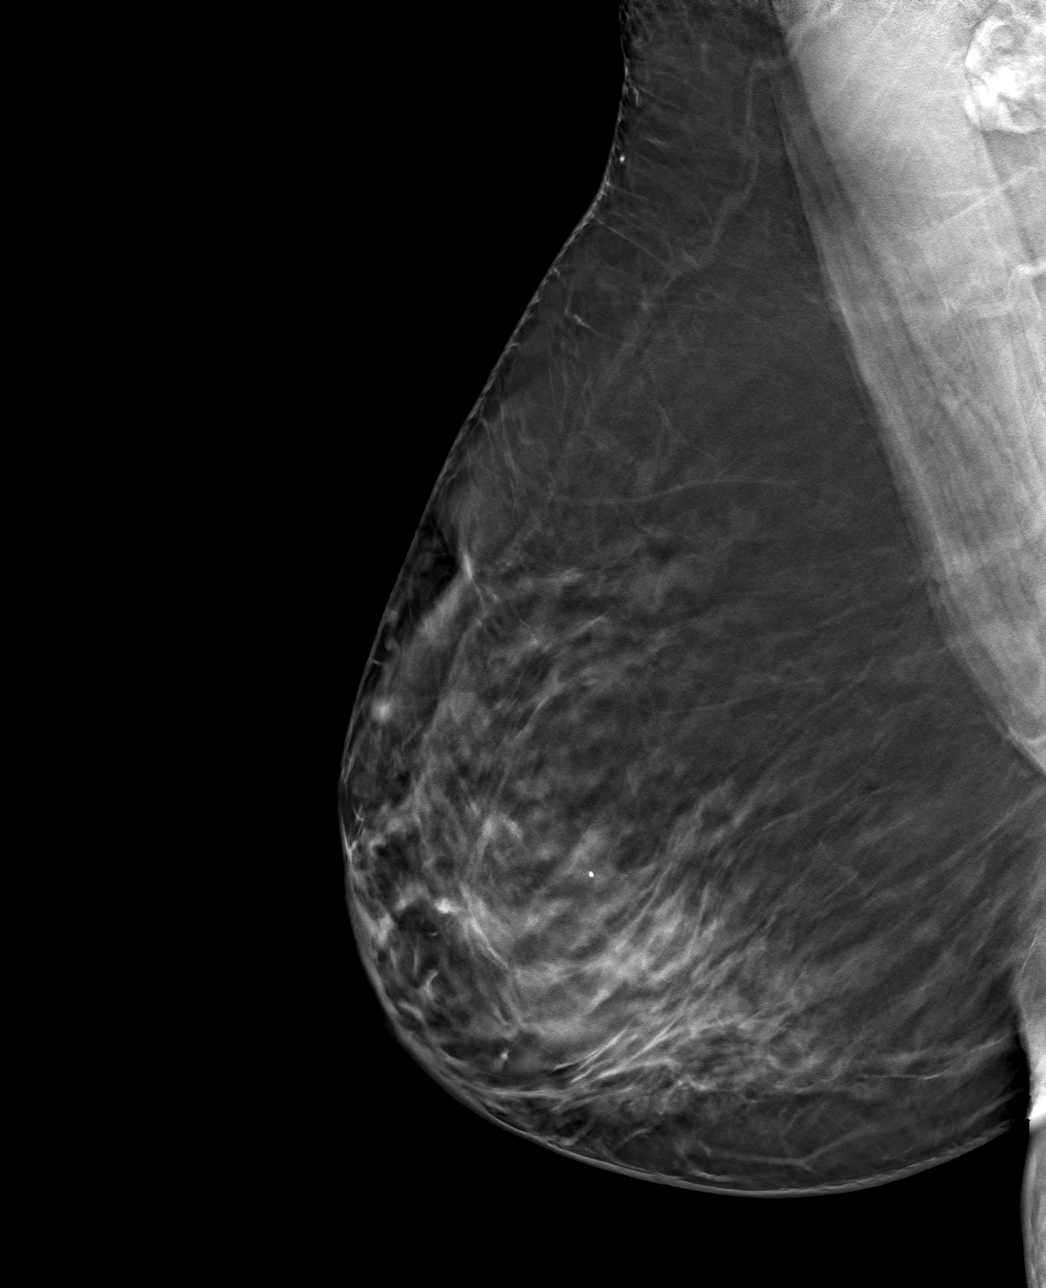

[4 of 12 positions shown; findings below may reference images not displayed]

ACR Breast Density Category b: There are scattered areas of
fibroglandular density.
FINDINGS: 2D/3D full field views of both breasts demonstrate no persistent
suspicious mass, distortion or worrisome calcifications.

The possible RIGHT breast mass is no longer identified.

Mammographic images were processed with CAD.
IMPRESSION: No mammographic evidence of breast malignancy.

RECOMMENDATION:
Bilateral screening mammogram in 6 months to resume annual mammogram
schedule

I have discussed the findings and recommendations with the patient.
Results were also provided in writing at the conclusion of the
visit. If applicable, a reminder letter will be sent to the patient
regarding the next appointment.

BI-RADS CATEGORY  1: Negative.

## 2020-08-24 DIAGNOSIS — Z6832 Body mass index (BMI) 32.0-32.9, adult: Secondary | ICD-10-CM | POA: Diagnosis not present

## 2020-08-24 DIAGNOSIS — Z01419 Encounter for gynecological examination (general) (routine) without abnormal findings: Secondary | ICD-10-CM | POA: Diagnosis not present

## 2020-10-14 DIAGNOSIS — Z0000 Encounter for general adult medical examination without abnormal findings: Secondary | ICD-10-CM | POA: Diagnosis not present

## 2020-10-14 DIAGNOSIS — E559 Vitamin D deficiency, unspecified: Secondary | ICD-10-CM | POA: Diagnosis not present

## 2020-10-14 DIAGNOSIS — I10 Essential (primary) hypertension: Secondary | ICD-10-CM | POA: Diagnosis not present

## 2020-10-14 DIAGNOSIS — E782 Mixed hyperlipidemia: Secondary | ICD-10-CM | POA: Diagnosis not present

## 2020-10-20 DIAGNOSIS — M65311 Trigger thumb, right thumb: Secondary | ICD-10-CM | POA: Diagnosis not present

## 2020-11-19 DIAGNOSIS — M65311 Trigger thumb, right thumb: Secondary | ICD-10-CM | POA: Diagnosis not present

## 2021-03-02 DIAGNOSIS — N898 Other specified noninflammatory disorders of vagina: Secondary | ICD-10-CM | POA: Diagnosis not present

## 2021-03-02 DIAGNOSIS — N952 Postmenopausal atrophic vaginitis: Secondary | ICD-10-CM | POA: Diagnosis not present

## 2021-05-17 DIAGNOSIS — G2581 Restless legs syndrome: Secondary | ICD-10-CM | POA: Diagnosis not present

## 2021-05-25 DIAGNOSIS — M65311 Trigger thumb, right thumb: Secondary | ICD-10-CM | POA: Diagnosis not present

## 2021-05-31 DIAGNOSIS — Z20822 Contact with and (suspected) exposure to covid-19: Secondary | ICD-10-CM | POA: Diagnosis not present

## 2021-06-03 DIAGNOSIS — U071 COVID-19: Secondary | ICD-10-CM | POA: Diagnosis not present

## 2021-06-22 DIAGNOSIS — M65311 Trigger thumb, right thumb: Secondary | ICD-10-CM | POA: Diagnosis not present

## 2021-06-25 DIAGNOSIS — M65311 Trigger thumb, right thumb: Secondary | ICD-10-CM | POA: Diagnosis not present

## 2021-08-16 DIAGNOSIS — Z1231 Encounter for screening mammogram for malignant neoplasm of breast: Secondary | ICD-10-CM | POA: Diagnosis not present

## 2021-08-16 DIAGNOSIS — Z01419 Encounter for gynecological examination (general) (routine) without abnormal findings: Secondary | ICD-10-CM | POA: Diagnosis not present

## 2021-08-16 DIAGNOSIS — Z6832 Body mass index (BMI) 32.0-32.9, adult: Secondary | ICD-10-CM | POA: Diagnosis not present

## 2021-08-16 DIAGNOSIS — Z90711 Acquired absence of uterus with remaining cervical stump: Secondary | ICD-10-CM | POA: Diagnosis not present

## 2021-10-27 DIAGNOSIS — E559 Vitamin D deficiency, unspecified: Secondary | ICD-10-CM | POA: Diagnosis not present

## 2021-10-27 DIAGNOSIS — I10 Essential (primary) hypertension: Secondary | ICD-10-CM | POA: Diagnosis not present

## 2021-10-27 DIAGNOSIS — Z0000 Encounter for general adult medical examination without abnormal findings: Secondary | ICD-10-CM | POA: Diagnosis not present

## 2021-10-27 DIAGNOSIS — E782 Mixed hyperlipidemia: Secondary | ICD-10-CM | POA: Diagnosis not present

## 2021-11-03 DIAGNOSIS — H40013 Open angle with borderline findings, low risk, bilateral: Secondary | ICD-10-CM | POA: Diagnosis not present

## 2022-03-12 DIAGNOSIS — Z23 Encounter for immunization: Secondary | ICD-10-CM | POA: Diagnosis not present

## 2022-04-15 DIAGNOSIS — I10 Essential (primary) hypertension: Secondary | ICD-10-CM | POA: Diagnosis not present

## 2022-05-19 ENCOUNTER — Other Ambulatory Visit: Payer: Self-pay | Admitting: Family Medicine

## 2022-05-19 DIAGNOSIS — I10 Essential (primary) hypertension: Secondary | ICD-10-CM

## 2022-06-15 ENCOUNTER — Other Ambulatory Visit: Payer: Federal, State, Local not specified - PPO

## 2022-06-22 ENCOUNTER — Ambulatory Visit
Admission: RE | Admit: 2022-06-22 | Discharge: 2022-06-22 | Disposition: A | Discharge status: Home / Self Care | Payer: Federal, State, Local not specified - PPO | Source: Ambulatory Visit | Attending: Family Medicine | Admitting: Family Medicine

## 2022-06-22 DIAGNOSIS — I10 Essential (primary) hypertension: Secondary | ICD-10-CM | POA: Diagnosis not present

## 2022-06-22 DIAGNOSIS — K760 Fatty (change of) liver, not elsewhere classified: Secondary | ICD-10-CM | POA: Diagnosis not present

## 2022-08-22 DIAGNOSIS — Z01419 Encounter for gynecological examination (general) (routine) without abnormal findings: Secondary | ICD-10-CM | POA: Diagnosis not present

## 2022-08-22 DIAGNOSIS — Z1231 Encounter for screening mammogram for malignant neoplasm of breast: Secondary | ICD-10-CM | POA: Diagnosis not present

## 2022-08-22 DIAGNOSIS — Z6830 Body mass index (BMI) 30.0-30.9, adult: Secondary | ICD-10-CM | POA: Diagnosis not present

## 2022-09-20 DIAGNOSIS — L309 Dermatitis, unspecified: Secondary | ICD-10-CM | POA: Diagnosis not present

## 2022-10-17 DIAGNOSIS — D485 Neoplasm of uncertain behavior of skin: Secondary | ICD-10-CM | POA: Diagnosis not present

## 2022-10-17 DIAGNOSIS — L309 Dermatitis, unspecified: Secondary | ICD-10-CM | POA: Diagnosis not present

## 2022-11-09 DIAGNOSIS — Z0000 Encounter for general adult medical examination without abnormal findings: Secondary | ICD-10-CM | POA: Diagnosis not present

## 2022-11-09 DIAGNOSIS — I10 Essential (primary) hypertension: Secondary | ICD-10-CM | POA: Diagnosis not present

## 2022-11-09 DIAGNOSIS — E559 Vitamin D deficiency, unspecified: Secondary | ICD-10-CM | POA: Diagnosis not present

## 2022-11-09 DIAGNOSIS — E782 Mixed hyperlipidemia: Secondary | ICD-10-CM | POA: Diagnosis not present

## 2022-11-09 DIAGNOSIS — Z23 Encounter for immunization: Secondary | ICD-10-CM | POA: Diagnosis not present

## 2023-01-23 DIAGNOSIS — H40013 Open angle with borderline findings, low risk, bilateral: Secondary | ICD-10-CM | POA: Diagnosis not present

## 2023-06-12 DIAGNOSIS — B3731 Acute candidiasis of vulva and vagina: Secondary | ICD-10-CM | POA: Diagnosis not present

## 2023-06-12 DIAGNOSIS — R102 Pelvic and perineal pain: Secondary | ICD-10-CM | POA: Diagnosis not present

## 2023-06-12 DIAGNOSIS — R300 Dysuria: Secondary | ICD-10-CM | POA: Diagnosis not present

## 2023-06-20 DIAGNOSIS — M5442 Lumbago with sciatica, left side: Secondary | ICD-10-CM | POA: Diagnosis not present

## 2023-06-20 DIAGNOSIS — M5441 Lumbago with sciatica, right side: Secondary | ICD-10-CM | POA: Diagnosis not present

## 2023-07-05 DIAGNOSIS — I10 Essential (primary) hypertension: Secondary | ICD-10-CM | POA: Diagnosis not present

## 2023-08-25 DIAGNOSIS — Z01419 Encounter for gynecological examination (general) (routine) without abnormal findings: Secondary | ICD-10-CM | POA: Diagnosis not present

## 2023-08-25 DIAGNOSIS — Z1331 Encounter for screening for depression: Secondary | ICD-10-CM | POA: Diagnosis not present

## 2023-08-25 DIAGNOSIS — Z1231 Encounter for screening mammogram for malignant neoplasm of breast: Secondary | ICD-10-CM | POA: Diagnosis not present

## 2023-11-10 DIAGNOSIS — N898 Other specified noninflammatory disorders of vagina: Secondary | ICD-10-CM | POA: Diagnosis not present

## 2023-11-10 DIAGNOSIS — E559 Vitamin D deficiency, unspecified: Secondary | ICD-10-CM | POA: Diagnosis not present

## 2023-11-10 DIAGNOSIS — E782 Mixed hyperlipidemia: Secondary | ICD-10-CM | POA: Diagnosis not present

## 2023-11-10 DIAGNOSIS — I10 Essential (primary) hypertension: Secondary | ICD-10-CM | POA: Diagnosis not present

## 2023-11-10 DIAGNOSIS — R350 Frequency of micturition: Secondary | ICD-10-CM | POA: Diagnosis not present

## 2023-11-10 DIAGNOSIS — Z0000 Encounter for general adult medical examination without abnormal findings: Secondary | ICD-10-CM | POA: Diagnosis not present

## 2024-01-09 ENCOUNTER — Emergency Department (HOSPITAL_BASED_OUTPATIENT_CLINIC_OR_DEPARTMENT_OTHER): Admitting: Radiology

## 2024-01-09 ENCOUNTER — Emergency Department (HOSPITAL_BASED_OUTPATIENT_CLINIC_OR_DEPARTMENT_OTHER)
Admission: EM | Admit: 2024-01-09 | Discharge: 2024-01-09 | Disposition: A | Discharge status: Home / Self Care | Attending: Emergency Medicine | Admitting: Emergency Medicine

## 2024-01-09 ENCOUNTER — Encounter (HOSPITAL_BASED_OUTPATIENT_CLINIC_OR_DEPARTMENT_OTHER): Payer: Self-pay | Admitting: Emergency Medicine

## 2024-01-09 ENCOUNTER — Other Ambulatory Visit: Payer: Self-pay

## 2024-01-09 DIAGNOSIS — K219 Gastro-esophageal reflux disease without esophagitis: Secondary | ICD-10-CM | POA: Diagnosis not present

## 2024-01-09 DIAGNOSIS — Z79899 Other long term (current) drug therapy: Secondary | ICD-10-CM | POA: Insufficient documentation

## 2024-01-09 DIAGNOSIS — R0789 Other chest pain: Secondary | ICD-10-CM

## 2024-01-09 DIAGNOSIS — E119 Type 2 diabetes mellitus without complications: Secondary | ICD-10-CM | POA: Diagnosis not present

## 2024-01-09 DIAGNOSIS — R079 Chest pain, unspecified: Secondary | ICD-10-CM | POA: Diagnosis not present

## 2024-01-09 DIAGNOSIS — I10 Essential (primary) hypertension: Secondary | ICD-10-CM | POA: Diagnosis not present

## 2024-01-09 LAB — BASIC METABOLIC PANEL WITH GFR
Anion gap: 11 (ref 5–15)
BUN: 13 mg/dL (ref 6–20)
CO2: 25 mmol/L (ref 22–32)
Calcium: 9.7 mg/dL (ref 8.9–10.3)
Chloride: 106 mmol/L (ref 98–111)
Creatinine, Ser: 0.89 mg/dL (ref 0.44–1.00)
GFR, Estimated: >60 mL/min (ref >60)
Glucose, Bld: 99 mg/dL (ref 70–99)
Potassium: 3.8 mmol/L (ref 3.5–5.1)
Sodium: 141 mmol/L (ref 135–145)

## 2024-01-09 LAB — CBC
HCT: 42.5 % (ref 36.0–46.0)
Hemoglobin: 13.9 g/dL (ref 12.0–15.0)
MCH: 28.4 pg (ref 26.0–34.0)
MCHC: 32.7 g/dL (ref 30.0–36.0)
MCV: 86.7 fL (ref 80.0–100.0)
Platelets: 239 K/uL (ref 150–400)
RBC: 4.9 MIL/uL (ref 3.87–5.11)
RDW: 14.2 % (ref 11.5–15.5)
WBC: 10.7 K/uL — ABNORMAL HIGH (ref 4.0–10.5)
nRBC: 0 % (ref 0.0–0.2)

## 2024-01-09 LAB — PRO BRAIN NATRIURETIC PEPTIDE: Pro Brain Natriuretic Peptide: <36 pg/mL (ref <300.0)

## 2024-01-09 LAB — TROPONIN T, HIGH SENSITIVITY: Troponin T High Sensitivity: <15 ng/L (ref <19)

## 2024-01-09 MED ORDER — PANTOPRAZOLE SODIUM 20 MG PO TBEC: 20.0000 mg | DELAYED_RELEASE_TABLET | Freq: Every day | ORAL | 2 refills | Status: AC

## 2024-01-09 MED ORDER — ALUM & MAG HYDROXIDE-SIMETH 200-200-20 MG/5ML PO SUSP
30.0000 mL | Freq: Once | ORAL | Status: AC
Start: 2024-01-09 — End: 2024-01-09
  Administered 2024-01-09: 30 mL via ORAL
  Filled 2024-01-09: qty 30

## 2024-01-09 MED ORDER — LIDOCAINE VISCOUS HCL 2 % MT SOLN
15.0000 mL | Freq: Once | OROMUCOSAL | Status: AC
  Administered 2024-01-09: 15 mL via ORAL
  Filled 2024-01-09: qty 15

## 2024-01-09 NOTE — ED Provider Notes (Signed)
 [place] EMERGENCY DEPARTMENT AT [facility] Provider Note   CSN: [id number] Arrival date & time: 01/09/24  1811     Patient presents with: Chest Pain   [name] is a 58 y.o. female with past medical history significant for HTN, GERD, hiatal hernia, no previous hx of HLD, diabetes, tobacco use presents concern for central chest pain radiating to right side of neck since this morning.  History of GERD, hiatal hernia.  She reports the symptoms are nonexertional.  Denies nausea, vomiting.  Pain has been constant since this morning but comes and goes in waves.  Reports that it feels like indigestion.    Chest Pain      Prior to Admission medications   Medication Sig Start Date End Date Taking? Authorizing Provider  pantoprazole  (PROTONIX ) 20 MG tablet Take 1 tablet (20 mg total) by mouth daily. 01/09/24  Yes Alexandre Lightsey H, PA-C  acetaminophen (TYLENOL) 500 MG tablet Take 1,000 mg by mouth every 6 (six) hours as needed for headache.    [provider]  amLODipine (NORVASC) 5 MG tablet Take 2.5 mg by mouth at bedtime.     [provider]  Biotin 2500 MCG CAPS Take 1 capsule by mouth at bedtime.    [provider]  cholecalciferol (VITAMIN D) 1000 UNITS tablet Take 1,000 Units by mouth at bedtime.    [provider]  diphenhydrAMINE (SOMINEX) 25 MG tablet Take 25 mg by mouth daily as needed for sleep (itchy eyes.).    [provider]  Multiple Vitamin (MULTIVITAMIN WITH MINERALS) TABS tablet Take 1 tablet by mouth daily. 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    [provider]  omeprazole-sodium bicarbonate (ZEGERID) 40-1100 MG per capsule Take 1 capsule by mouth daily as needed (indigestion.).  10/15/14   [provider]    Allergies: Guaifenesin & derivatives    Review of Systems  Cardiovascular:  Positive for chest pain.  All other systems reviewed and are negative.   Updated Vital Signs BP (!) 149/98   Pulse 84    Temp 97.8 F (36.6 C)   Resp 20   SpO2 97%   Physical Exam Vitals and nursing note reviewed.  Constitutional:      General: She is not in acute distress.    Appearance: Normal appearance.  HENT:     Head: Normocephalic and atraumatic.  Eyes:     General:        Right eye: No discharge.        Left eye: No discharge.  Cardiovascular:     Rate and Rhythm: Normal rate and regular rhythm.     Heart sounds: No murmur heard.    No friction rub. No gallop.  Pulmonary:     Effort: Pulmonary effort is normal.     Breath sounds: Normal breath sounds.  Abdominal:     General: Bowel sounds are normal.     Palpations: Abdomen is soft.  Skin:    General: Skin is warm and dry.     Capillary Refill: Capillary refill takes less than 2 seconds.  Neurological:     Mental Status: She is alert and oriented to person, place, and time.  Psychiatric:        Mood and Affect: Mood normal.        Behavior: Behavior normal.     (all labs ordered are listed, but only abnormal results are displayed) Labs Reviewed  CBC - Abnormal; Notable for the following components:  Result Value   WBC 10.7 (*)    All other components within normal limits  BASIC METABOLIC PANEL WITH GFR  PRO BRAIN NATRIURETIC PEPTIDE  TROPONIN T, HIGH SENSITIVITY    EKG: EKG Interpretation Date/Time:  Tuesday January 09 2024 18:19:34 EDT Ventricular Rate:  98 PR Interval:  126 QRS Duration:  78 QT Interval:  346 QTC Calculation: 441 R Axis:   79  Text Interpretation: Normal sinus rhythm No previous ECGs available Confirmed by Cottie Cough [phone]) on 01/09/2024 7:05:02 PM  Radiology: DG Chest 2 View Result Date: 01/09/2024 CLINICAL DATA:  Chest pain EXAM: CHEST - 2 VIEW COMPARISON:  Chest x-ray 05/26/2014 FINDINGS: The heart size and mediastinal contours are within normal limits. Both lungs are clear. The visualized skeletal structures are unremarkable. IMPRESSION: No active cardiopulmonary disease. Electronically  Signed   By: Greig Pique M.D.   On: 01/09/2024 18:42     Procedures   Medications Ordered in the ED  alum & mag hydroxide-simeth (MAALOX/MYLANTA) 200-200-20 MG/5ML suspension 30 mL (30 mLs Oral Given 01/09/24 1925)    And  lidocaine  (XYLOCAINE ) 2 % viscous mouth solution 15 mL (15 mLs Oral Given 01/09/24 1925)                                    Medical Decision Making Amount and/or Complexity of Data Reviewed Labs: ordered. Radiology: ordered.   This patient is a 58 y.o. female  who presents to the ED for concern of chest pain.   Differential diagnoses prior to evaluation: The emergent differential diagnosis includes, but is not limited to,  ACS, AAS, PE, Mallory-Weiss, Boerhaave's, Pneumonia, acute bronchitis, asthma or COPD exacerbation, anxiety, MSK pain or traumatic injury to the chest, acid reflux versus other . This is not an exhaustive differential.   Past Medical History / Co-morbidities / Social History: HTN, gerd, hiatal hernia  Additional history: Chart reviewed. Pertinent results include: Reviewed previous renal artery ultrasound, outpatient PCP visits  Physical Exam: Physical exam performed. The pertinent findings include: Vital signs overall stable in the emergency department with evidence of hypertension, blood pressure 164/88 on arrival, improved to 149/98 on recheck.  Normal oxygen saturation on room air.  Lab Tests/Imaging studies: I personally interpreted labs/imaging and the pertinent results include: CBC unremarkable other than mild leukocytosis, blood cells 10.7, normal troponin x 1 in context of greater than 6 hours of chest pain, unremarkable BNP, unremarkable BMP.  Idabel interpreted plain film chest x-ray which shows no evidence of acute intrathoracic abnormality.  I agree with the radiologist interpretation.  Cardiac monitoring: EKG obtained and interpreted by myself and attending physician which shows: NSR, no acute ST-T changes   Medications: I  ordered medication including GI cocktail.  I have reviewed the patients home medicines and have made adjustments as needed.  On reevaluation patient feeling improved.  Given her history of GERD, hiatal hernia and her improvement after taking GI cocktail I have high clinical suspicion that her pain today was related to acid reflux/esophagitis presentation, she is stable for discharge at this time, will discharge with Protonix , encouraged her to follow-up with PCP, GI doctor.   Disposition: After consideration of the diagnostic results and the patients response to treatment, I feel that patient is stable for discharge with plan as above.   emergency department workup does not suggest an emergent condition requiring admission or immediate intervention beyond what has been performed at  this time. The plan is: as above. The patient is safe for discharge and has been instructed to return immediately for worsening symptoms, change in symptoms or any other concerns.   Final diagnoses:  Atypical chest pain  Gastroesophageal reflux disease, unspecified whether esophagitis present    ED Discharge Orders          Ordered    pantoprazole  (PROTONIX ) 20 MG tablet  Daily        01/09/24 2010               Rosan Sherlean VEAR DEVONNA 01/09/24 2013    Cottie Donnice PARAS, MD 01/09/24 2047

## 2024-01-09 NOTE — Discharge Instructions (Signed)
 You can take the medication I have prescribed for acid reflux, follow up with your primary care doctor.  Please return if you have worsening chest pain, new shortness of breath, nausea, vomiting despite taking the new medication.  If your acid reflux symptoms persist despite taking the medication you may also want to follow-up with the GI doctor to get something called an endoscopy.  If the pain changes, it seems like it may be unrelated to your acid reflux and hiatal hernia you may want to follow-up with a cardiologist for an echo and stress test.

## 2024-01-09 NOTE — ED Triage Notes (Signed)
 Pt arrived with c/o central chest pain radiating to her R neck that started this morning. States it feels like indigestion.

## 2024-01-09 NOTE — ED Notes (Signed)
 AVS provided by edp was reviewed with the pt. Pt was able to verbalize understanding with no additional questions at this time. Pharmacy verified with pt. Pt going home with s/o at bedside.

## 2024-01-22 ENCOUNTER — Ambulatory Visit (INDEPENDENT_AMBULATORY_CARE_PROVIDER_SITE_OTHER): Admitting: Otolaryngology

## 2024-01-22 ENCOUNTER — Encounter (INDEPENDENT_AMBULATORY_CARE_PROVIDER_SITE_OTHER): Payer: Self-pay | Admitting: Otolaryngology

## 2024-01-22 VITALS — BP 167/103 | HR 102

## 2024-01-22 DIAGNOSIS — K219 Gastro-esophageal reflux disease without esophagitis: Secondary | ICD-10-CM | POA: Diagnosis not present

## 2024-01-22 DIAGNOSIS — R196 Halitosis: Secondary | ICD-10-CM | POA: Diagnosis not present

## 2024-01-22 DIAGNOSIS — R0981 Nasal congestion: Secondary | ICD-10-CM

## 2024-01-22 DIAGNOSIS — R0982 Postnasal drip: Secondary | ICD-10-CM

## 2024-01-22 DIAGNOSIS — J3089 Other allergic rhinitis: Secondary | ICD-10-CM

## 2024-01-22 DIAGNOSIS — J351 Hypertrophy of tonsils: Secondary | ICD-10-CM

## 2024-01-22 MED ORDER — LEVOCETIRIZINE DIHYDROCHLORIDE 5 MG PO TABS: 5.0000 mg | ORAL_TABLET | Freq: Every evening | ORAL | 3 refills | Status: AC

## 2024-01-22 MED ORDER — FLUTICASONE PROPIONATE 50 MCG/ACT NA SUSP: 2.0000 | Freq: Two times a day (BID) | NASAL | 6 refills | Status: DC

## 2024-01-22 NOTE — Patient Instructions (Signed)

## 2024-01-22 NOTE — Progress Notes (Signed)
 ENT CONSULT:  Reason for Consult: halitosis x 1984   HPI: Discussed the use of AI scribe software for clinical note transcription with the patient, who gave verbal consent to proceed.  History of Present Illness Sylvia Sanford is a 58 year old female who presents with concerns of chronic halitosis.  She has experienced chronic halitosis since 1984. Despite regular dental visits and several endoscopies with GI, she has not pursued specific treatments for the bad breath. She recently started taking Protonix  for her history of heartburn.  No history of postnasal drainage, chronic nasal congestion, or sinus infections requiring antibiotics or steroids. She reports food allergies but no nasal issues. A tonsil infection occurred three years ago, no further infections and no tonsil stones.  Her current medications include Protonix , which she has just started taking. She does not use nasal sprays or other related medications.     Past Medical History:  Diagnosis Date   GERD (gastroesophageal reflux disease)    History of hiatal hernia    Hypertension     Past Surgical History:  Procedure Laterality Date   ABDOMINAL HYSTERECTOMY     partial   BREAST EXCISIONAL BIOPSY Right    benign   BREAST SURGERY     right breast-benign   CHOLECYSTECTOMY     COLONOSCOPY WITH PROPOFOL  N/A 12/02/2014   Procedure: COLONOSCOPY WITH PROPOFOL ;  Surgeon: Gladis MARLA Louder, MD;  Location: [facility];  Service: Endoscopy;  Laterality: N/A;   TUBAL LIGATION      History reviewed. No pertinent family history.  Social History:  reports that she has never smoked. She does not have any smokeless tobacco history on file. She reports current alcohol use. She reports that she does not use drugs.  Allergies:  Allergies  Allergen Reactions   Guaifenesin & Derivatives Other (See Comments)    Dry heaves     Medications: I have reviewed the patient's current medications.  The PMH, PSH, Medications, Allergies,  and SH were reviewed and updated.  ROS: Constitutional: Negative for fever, weight loss and weight gain. Cardiovascular: Negative for chest pain and dyspnea on exertion. Respiratory: Is not experiencing shortness of breath at rest. Gastrointestinal: Negative for nausea and vomiting. Neurological: Negative for headaches. Psychiatric: The patient is not nervous/anxious  Blood pressure (!) 167/103, pulse (!) 102, SpO2 95%.  PHYSICAL EXAM:  Blood pressure (!) 167/103, pulse (!) 102, SpO2 95%. There is no height or weight on file to calculate BMI.  PHYSICAL EXAM:  Exam: General: Well-developed, well-nourished Communication and Voice: Clear pitch and clarity Respiratory Respiratory effort: Equal inspiration and expiration without stridor Cardiovascular Peripheral Vascular: Warm extremities with equal color/perfusion Eyes: No nystagmus with equal extraocular motion bilaterally Neuro/Psych/Balance: Patient oriented to person, place, and time; Appropriate mood and affect; Gait is intact with no imbalance; Cranial nerves I-XII are intact Head and Face Inspection: Normocephalic and atraumatic without mass or lesion Palpation: Facial skeleton intact without bony stepoffs Salivary Glands: No mass or tenderness Facial Strength: Facial motility symmetric and full bilaterally ENT Pinna: External ear intact and fully developed External canal: Canal is patent with intact skin Tympanic Membrane: Clear and mobile External Nose: No scar or anatomic deformity Internal Nose: Septum is deviated to the left. No polyp, or purulence. Mucosal edema and erythema present.  Bilateral inferior turbinate hypertrophy.  Lips, Teeth, and gums: Mucosa and teeth intact and viable TMJ: No pain to palpation with full mobility Oral cavity/oropharynx: No erythema or exudate, no lesions present Nasopharynx: No mass or  lesion with intact mucosa Hypopharynx: Intact mucosa without pooling of  secretions Larynx Glottic: Full true vocal cord mobility without lesion or mass Supraglottic: Normal appearing epiglottis and AE folds Interarytenoid Space: Moderate pachydermia&edema Subglottic Space: Patent without lesion or edema Neck Neck and Trachea: Midline trachea without mass or lesion Thyroid: No mass or nodularity Lymphatics: No lymphadenopathy   Procedure:   PROCEDURE NOTE: nasal endoscopy  Preoperative diagnosis: chronic sinusitis symptoms  Postoperative diagnosis: same  Procedure: Diagnostic nasal endoscopy ([id number])  Surgeon: Elena Larry, M.D.  Anesthesia: Topical lidocaine and Afrin  H&P REVIEW: The patient's history and physical were reviewed today prior to procedure. All medications were reviewed and updated as well. Complications: None Condition is stable throughout exam Indications and consent: The patient presents with symptoms of chronic sinusitis not responding to previous therapies. All the risks, benefits, and potential complications were reviewed with the patient preoperatively and informed consent was obtained. The time out was completed with confirmation of the correct procedure.   Procedure: The patient was seated upright in the clinic. Topical lidocaine and Afrin were applied to the nasal cavity. After adequate anesthesia had occurred, the rigid nasal endoscope was passed into the nasal cavity. The nasal mucosa, turbinates, septum, and sinus drainage pathways were visualized bilaterally. This revealed no purulence or significant secretions that might be cultured. There were no polyps or sites of significant inflammation. The mucosa was intact and there was no crusting present. The scope was then slowly withdrawn and the patient tolerated the procedure well. There were no complications or blood loss.  Preoperative diagnosis: halitosis   Postoperative diagnosis:   Same + GERD LPR  Procedure: Flexible fiberoptic laryngoscopy  Surgeon: Renarda Mullinix, MD  Anesthesia: Topical lidocaine and Afrin Complications: None Condition is stable throughout exam  Indications and consent:  The patient presents to the clinic with above symptoms. Indirect laryngoscopy view was incomplete. Thus it was recommended that they undergo a flexible fiberoptic laryngoscopy. All of the risks, benefits, and potential complications were reviewed with the patient preoperatively and verbal informed consent was obtained.  Procedure: The patient was seated upright in the clinic. Topical lidocaine and Afrin were applied to the nasal cavity. After adequate anesthesia had occurred, I then proceeded to pass the flexible telescope into the nasal cavity. The nasal cavity was patent without rhinorrhea or polyp. The nasopharynx was also patent without mass or lesion. The base of tongue was visualized and was normal. There were no signs of pooling of secretions in the piriform sinuses. The true vocal folds were mobile bilaterally. There were no signs of glottic or supraglottic mucosal lesion or mass. There was moderate interarytenoid pachydermia and post cricoid edema. The telescope was then slowly withdrawn and the patient tolerated the procedure throughout.   Studies Reviewed: CXR 05/26/2014 - normal exam  Assessment/Plan: Encounter Diagnoses  Name Primary?   Halitosis Yes   Chronic nasal congestion    Environmental and seasonal allergies    Post-nasal drip    Chronic GERD    Tonsillar hypertrophy [J35.1]     Assessment and Plan Assessment & Plan Halitosis Chronic halitosis since 1984 with unremarkable dental evaluation. She reports seeing GI in the past and having negative scope exam with them although we do not have records to review.  Possible contributors include postnasal drainage, chronic reflux irritation, or dietary factors. Exam without purulent nasal drainage on nasal endoscopy and flexible laryngoscopy with findings c/w GERD LPR. No tonsil stones with  mild tonsillar hypertrophy on exam.  -  Provided after visit summary with management instructions for chronic nasal congestion and GERD. - Recommended gargling with salt water in the morning. - Advised dietary modifications and considered probiotics. - Continued Protonix  for GERD. - Recommended Reflux Gourmet supplement after meals. - Considered GI evaluation for small bowel bacterial overgrowth if symptoms persist.  Chronic nasal congestion Mild nasal congestion with no history of sinus infections requiring antibiotics or steroids.  - Prescribed a trial of Xyzal  5 mg at bedtime and Flonase  BID  Gastroesophageal reflux disease (GERD) LPR - continue Protonix  20 mg daily  -  Reflux Gourmet after meals - diet and lifestyle changes to minimize GERD - Refer to [employer] blog for dietary and lifestyle modifications/reflux cook book   Thank you for allowing me to participate in the care of this patient. Please do not hesitate to contact me with any questions or concerns.   Elena Larry, MD Otolaryngology [facility] Phone: [phone] Fax: [phone]    01/22/2024, 9:43 AM

## 2024-03-06 DIAGNOSIS — R632 Polyphagia: Secondary | ICD-10-CM | POA: Diagnosis not present

## 2024-03-06 DIAGNOSIS — R4586 Emotional lability: Secondary | ICD-10-CM | POA: Diagnosis not present

## 2024-03-06 DIAGNOSIS — R635 Abnormal weight gain: Secondary | ICD-10-CM | POA: Diagnosis not present

## 2024-03-06 DIAGNOSIS — N951 Menopausal and female climacteric states: Secondary | ICD-10-CM | POA: Diagnosis not present

## 2024-04-19 ENCOUNTER — Other Ambulatory Visit (INDEPENDENT_AMBULATORY_CARE_PROVIDER_SITE_OTHER): Payer: Self-pay | Admitting: Otolaryngology

## 2024-04-29 DIAGNOSIS — L918 Other hypertrophic disorders of the skin: Secondary | ICD-10-CM | POA: Diagnosis not present

## 2024-04-29 DIAGNOSIS — N951 Menopausal and female climacteric states: Secondary | ICD-10-CM | POA: Diagnosis not present

## 2024-05-29 DIAGNOSIS — R4586 Emotional lability: Secondary | ICD-10-CM | POA: Diagnosis not present

## 2024-05-29 DIAGNOSIS — N951 Menopausal and female climacteric states: Secondary | ICD-10-CM | POA: Diagnosis not present

## 2024-05-29 DIAGNOSIS — R635 Abnormal weight gain: Secondary | ICD-10-CM | POA: Diagnosis not present

## 2024-05-29 DIAGNOSIS — R632 Polyphagia: Secondary | ICD-10-CM | POA: Diagnosis not present
